# Patient Record
Sex: Female | Born: 1965 | Race: White | Hispanic: No | Marital: Married
Health system: Southern US, Community
[De-identification: ages and names within clinical notes are randomized; demographics above are authoritative.]

---

## 2013-07-10 ENCOUNTER — Emergency Department (HOSPITAL_BASED_OUTPATIENT_CLINIC_OR_DEPARTMENT_OTHER)
Admission: EM | Admit: 2013-07-10 | Discharge: 2013-07-10 | Disposition: A | Discharge status: Home / Self Care | Payer: 59 | Attending: Emergency Medicine | Admitting: Emergency Medicine

## 2013-07-10 ENCOUNTER — Emergency Department (HOSPITAL_BASED_OUTPATIENT_CLINIC_OR_DEPARTMENT_OTHER): Payer: 59

## 2013-07-10 ENCOUNTER — Encounter (HOSPITAL_BASED_OUTPATIENT_CLINIC_OR_DEPARTMENT_OTHER): Payer: Self-pay | Admitting: Emergency Medicine

## 2013-07-10 DIAGNOSIS — R109 Unspecified abdominal pain: Secondary | ICD-10-CM

## 2013-07-10 DIAGNOSIS — R079 Chest pain, unspecified: Secondary | ICD-10-CM

## 2013-07-10 LAB — COMPREHENSIVE METABOLIC PANEL
ALT: 15 U/L (ref 0–35)
AST: 19 U/L (ref 0–37)
Albumin: 3.9 g/dL (ref 3.5–5.2)
Alkaline Phosphatase: 89 U/L (ref 39–117)
BUN: 13 mg/dL (ref 6–23)
CALCIUM: 9.9 mg/dL (ref 8.4–10.5)
CO2: 26 mEq/L (ref 19–32)
Chloride: 99 mEq/L (ref 96–112)
Creatinine, Ser: 0.6 mg/dL (ref 0.50–1.10)
GFR calc non Af Amer: >90 mL/min (ref >90)
GLUCOSE: 96 mg/dL (ref 70–99)
Potassium: 3.8 mEq/L (ref 3.7–5.3)
SODIUM: 139 meq/L (ref 137–147)
Total Bilirubin: 0.3 mg/dL (ref 0.3–1.2)
Total Protein: 7.7 g/dL (ref 6.0–8.3)

## 2013-07-10 LAB — TROPONIN I: Troponin I: <0.3 ng/mL (ref <0.30)

## 2013-07-10 LAB — LIPASE, BLOOD: Lipase: 23 U/L (ref 11–59)

## 2013-07-10 LAB — D-DIMER, QUANTITATIVE (NOT AT ARMC): D-Dimer, Quant: 0.34 ug/mL-FEU (ref 0.00–0.48)

## 2013-07-10 MED ORDER — HYDROCODONE-ACETAMINOPHEN 5-325 MG PO TABS: 1.0000 | ORAL_TABLET | ORAL | Status: AC | PRN

## 2013-07-10 NOTE — ED Provider Notes (Signed)
CSN: [id number]     Arrival date & time 07/10/13  1629 History   First MD Initiated Contact with Patient 07/10/13 1631     Chief Complaint  Patient presents with  . Chest Pain     (Consider location/radiation/quality/duration/timing/severity/associated sxs/prior Treatment) HPI Comments: Pt states that she had acute onset of right sided chest pain last night that radiated to her chest and back. The attack lasted 5 minutes and resolved on its own. Pt states that she has had 2 more episodes today where the second one today the pain radiated to her chest and abdomen as well. She states that she has a pressure on the right side, but is not having the intense pain at this time. Denies diaphoresis, vomiting, sob. Pt states that over the last couple of weeks she has had problems with nausea and reflux. Pt states that she has been taking pepcid to help. She denies any medical problems. Pt states that the pain started both time after eating chili  The history is provided by the patient.    History reviewed. No pertinent past medical history. No past surgical history on file. No family history on file. History  Substance Use Topics  . Smoking status: Not on file  . Smokeless tobacco: Not on file  . Alcohol Use: Not on file   OB History   Grav Para Term Preterm Abortions TAB SAB Ect Mult Living                 Review of Systems  Constitutional: Negative.   Respiratory: Negative.   Cardiovascular: Positive for chest pain.      Allergies  Review of patient's allergies indicates not on file.  Home Medications  No current outpatient prescriptions on file. BP 151/93  Pulse 74  Temp(Src) 97.7 F (36.5 C) (Oral)  Resp 18  Ht 5' (1.524 m)  Wt 180 lb (81.647 kg)  BMI 35.15 kg/m2  SpO2 100% Physical Exam  Nursing note and vitals reviewed. Constitutional: She is oriented to person, place, and time. She appears well-developed and well-nourished.  HENT:  Head: Normocephalic and  atraumatic.  Cardiovascular: Normal rate and regular rhythm.   Pulmonary/Chest: Effort normal and breath sounds normal. She exhibits no tenderness.  Abdominal: Soft. Bowel sounds are normal. There is no tenderness.  Musculoskeletal: Normal range of motion.  Neurological: She is alert and oriented to person, place, and time.  Skin: Skin is warm and dry.  Psychiatric: She has a normal mood and affect.    ED Course  Procedures (including critical care time) Labs Review Labs Reviewed  COMPREHENSIVE METABOLIC PANEL  LIPASE, BLOOD  TROPONIN I  D-DIMER, QUANTITATIVE   Imaging Review Dg Chest 2 View  07/10/2013   CLINICAL DATA:  Chest pain.  EXAM: CHEST  2 VIEW  COMPARISON:  None.  FINDINGS: The heart size and mediastinal contours are within normal limits. Both lungs are clear. The visualized skeletal structures are unremarkable.  IMPRESSION: No active cardiopulmonary disease.   Electronically Signed   By: Salome Holmes M.D.   On: 07/10/2013 17:22     EKG Interpretation None      Date: 07/10/2013  Rate: 62  Rhythm: normal sinus rhythm  QRS Axis: normal  Intervals: normal  ST/T Wave abnormalities: normal  Conduction Disutrbances:none  Narrative Interpretation:   Old EKG Reviewed: none available   MDM   Final diagnoses:  Chest pain  Abdominal pain   Pt to come back tomorrow for an ultrasound. Discussed follow  up with pcp. Doubt acs. Pt is pain free at this time. Will send home with hydrocodone for pain as needed    Teressa LowerVrinda Ivelisse Culverhouse, NP 07/10/13 1840

## 2013-07-10 NOTE — ED Provider Notes (Signed)
  Medical screening examination/treatment/procedure(s) were performed by non-physician practitioner and as supervising physician I was immediately available for consultation/collaboration.  ECG had SR, rate 62, unremarkable   Gerhard Munchobert Demontray Franta, MD 07/10/13 1958

## 2013-07-10 NOTE — Discharge Instructions (Signed)
As discussed for continued or worsening symptoms. Follow up with your doctor Chest Pain (Nonspecific) It is often hard to give a specific diagnosis for the cause of chest pain. There is always a chance that your pain could be related to something serious, such as a heart attack or a blood clot in the lungs. You need to follow up with your caregiver for further evaluation. CAUSES   Heartburn.  Pneumonia or bronchitis.  Anxiety or stress.  Inflammation around your heart (pericarditis) or lung (pleuritis or pleurisy).  A blood clot in the lung.  A collapsed lung (pneumothorax). It can develop suddenly on its own (spontaneous pneumothorax) or from injury (trauma) to the chest.  Shingles infection (herpes zoster virus). The chest wall is composed of bones, muscles, and cartilage. Any of these can be the source of the pain.  The bones can be bruised by injury.  The muscles or cartilage can be strained by coughing or overwork.  The cartilage can be affected by inflammation and become sore (costochondritis). DIAGNOSIS  Lab tests or other studies, such as X-rays, electrocardiography, stress testing, or cardiac imaging, may be needed to find the cause of your pain.  TREATMENT   Treatment depends on what may be causing your chest pain. Treatment may include:  Acid blockers for heartburn.  Anti-inflammatory medicine.  Pain medicine for inflammatory conditions.  Antibiotics if an infection is present.  You may be advised to change lifestyle habits. This includes stopping smoking and avoiding alcohol, caffeine, and chocolate.  You may be advised to keep your head raised (elevated) when sleeping. This reduces the chance of acid going backward from your stomach into your esophagus.  Most of the time, nonspecific chest pain will improve within 2 to 3 days with rest and mild pain medicine. HOME CARE INSTRUCTIONS   If antibiotics were prescribed, take your antibiotics as directed. Finish  them even if you start to feel better.  For the next few days, avoid physical activities that bring on chest pain. Continue physical activities as directed.  Do not smoke.  Avoid drinking alcohol.  Only take over-the-counter or prescription medicine for pain, discomfort, or fever as directed by your caregiver.  Follow your caregiver's suggestions for further testing if your chest pain does not go away.  Keep any follow-up appointments you made. If you do not go to an appointment, you could develop lasting (chronic) problems with pain. If there is any problem keeping an appointment, you must call to reschedule. SEEK MEDICAL CARE IF:   You think you are having problems from the medicine you are taking. Read your medicine instructions carefully.  Your chest pain does not go away, even after treatment.  You develop a rash with blisters on your chest. SEEK IMMEDIATE MEDICAL CARE IF:   You have increased chest pain or pain that spreads to your arm, neck, jaw, back, or abdomen.  You develop shortness of breath, an increasing cough, or you are coughing up blood.  You have severe back or abdominal pain, feel nauseous, or vomit.  You develop severe weakness, fainting, or chills.  You have a fever. THIS IS AN EMERGENCY. Do not wait to see if the pain will go away. Get medical help at once. Call your local emergency services (911 in [place] Do not drive yourself to the hospital. MAKE SURE YOU:   Understand these instructions.  Will watch your condition.  Will get help right away if you are not doing well or get worse. Document Released:  01/12/2005 Document Revised: 06/27/2011 Document Reviewed: 11/08/2007 [facility] Patient Information 2014 [place].

## 2013-07-15 ENCOUNTER — Ambulatory Visit (HOSPITAL_BASED_OUTPATIENT_CLINIC_OR_DEPARTMENT_OTHER): Payer: 59

## 2015-04-06 IMAGING — CR DG CHEST 2V
2 series · 2 of 2 positions shown · non-contrast
Comparison: None.

CLINICAL DATA: Chest pain.

EXAM:
CHEST  2 VIEW

[w chest pa]
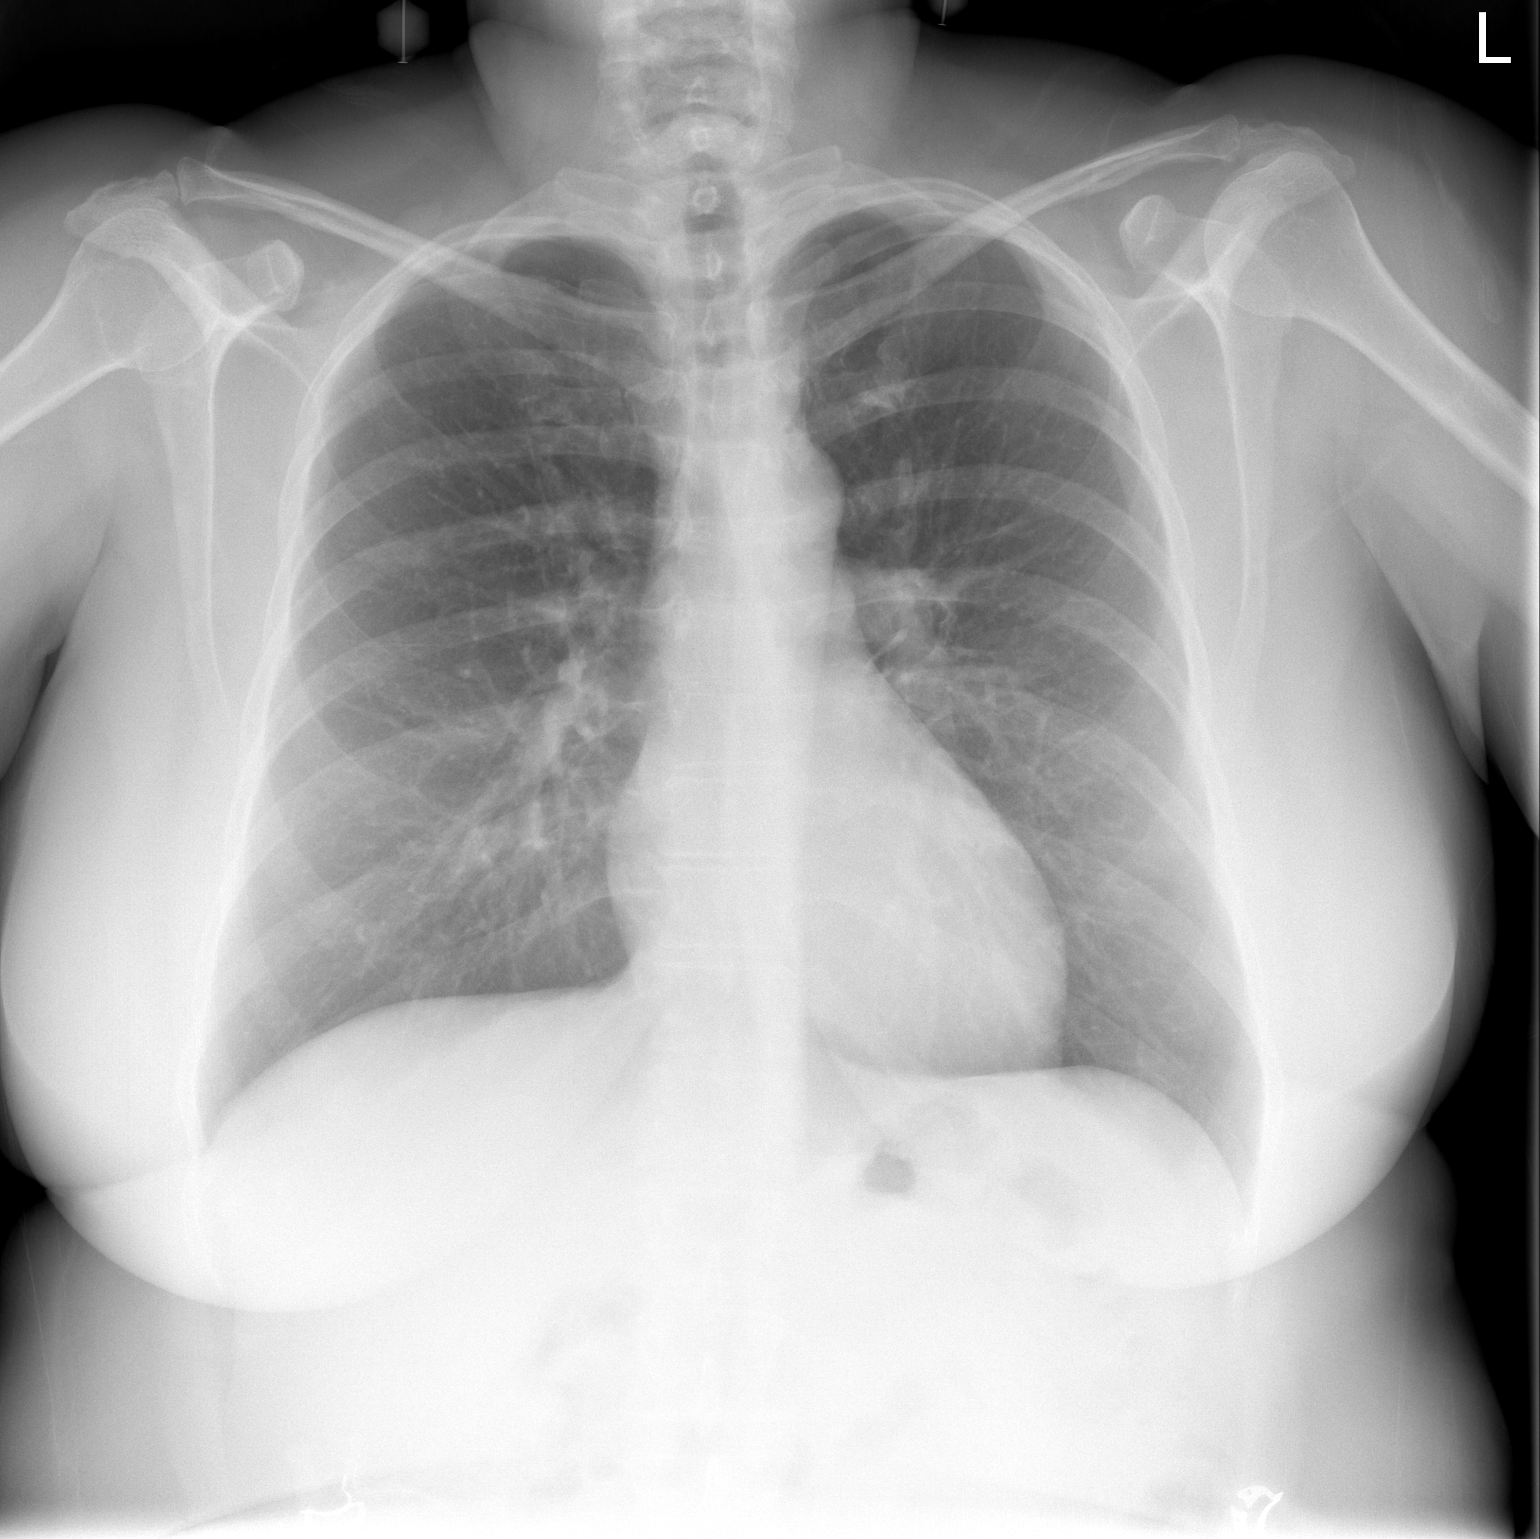

[w chest lat]
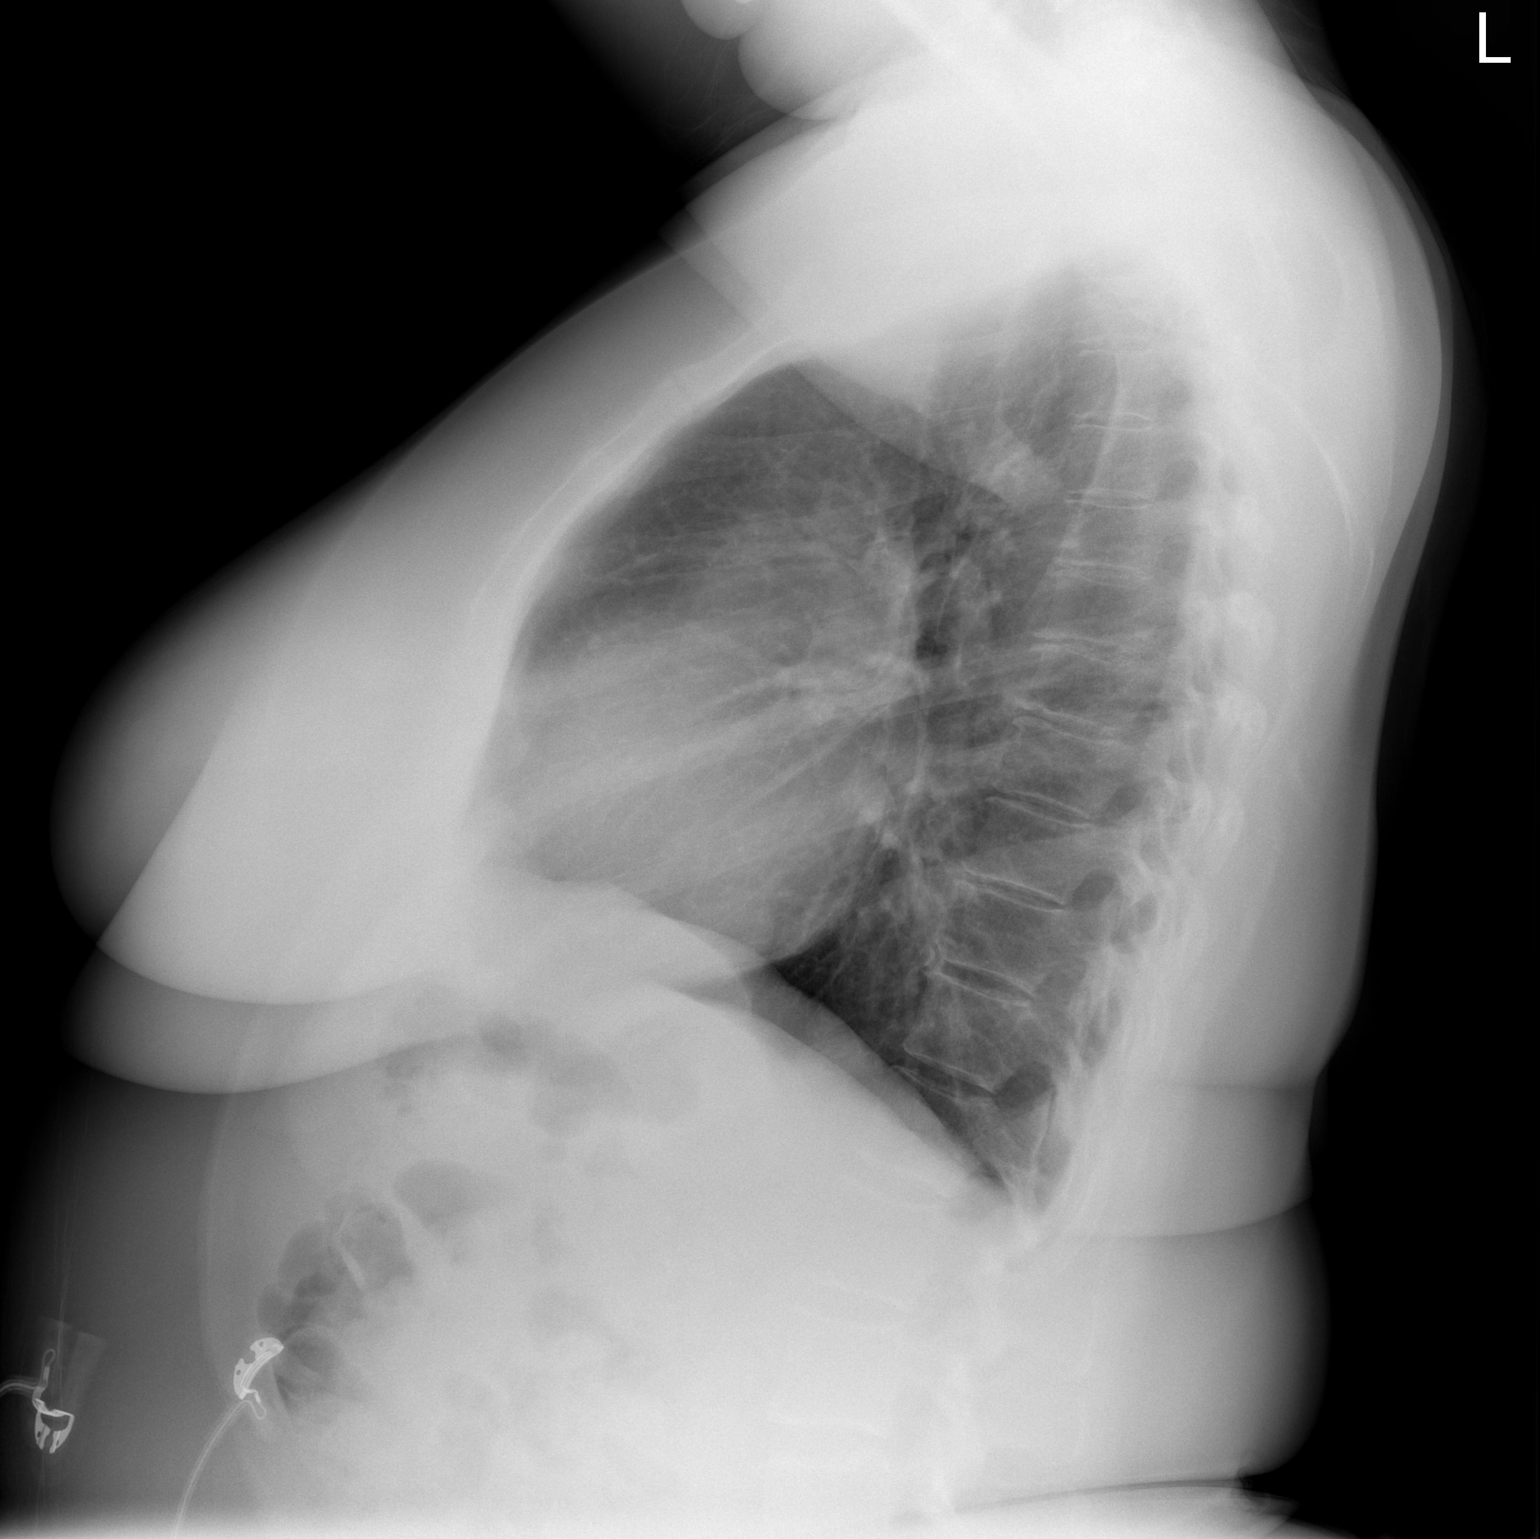

[2 of 2 positions shown; findings below may reference images not displayed]

FINDINGS: The heart size and mediastinal contours are within normal limits.
Both lungs are clear. The visualized skeletal structures are
unremarkable.
IMPRESSION: No active cardiopulmonary disease.

## 2018-07-18 NOTE — H&P (Signed)
TOTAL HIP ADMISSION H&P  Patient is admitted for right total hip arthroplasty, anterior approach  Subjective:  Chief Complaint: Right hip primary OA / pain  HPI: Alisha Banks, 53 y.o. female, has a history of pain and functional disability in the right hip(s) due to arthritis and patient has failed non-surgical conservative treatments for greater than 12 weeks to include NSAID's and/or analgesics, corticosteriod injections, use of assistive devices and activity modification.  Onset of symptoms was gradual starting ~1 years ago with gradually worsening course since that time.The patient noted no past surgery on the right hip(s).  Patient currently rates pain in the right hip at 6 out of 10 with activity. Patient has night pain, worsening of pain with activity and weight bearing, trendelenberg gait, pain that interfers with activities of daily living and pain with passive range of motion. Patient has evidence of periarticular osteophytes and joint space narrowing by imaging studies. This condition presents safety issues increasing the risk of falls.  There is no current active infection.  Risks, benefits and expectations were discussed with the patient.  Risks including but not limited to the risk of anesthesia, blood clots, nerve damage, blood vessel damage, failure of the prosthesis, infection and up to and including death.  Patient understand the risks, benefits and expectations and wishes to proceed with surgery.   PCP: Shellia Cleverly, PA  D/C Plans:       Home   Post-op Meds:       No Rx given  Tranexamic Acid:      To be given - IV   Decadron:      Is to be given  FYI:      ASA  Norco  DME:   Pt already has equipment   PT:   No PT   Pharmacy:   [facility]   There are no active problems to display for this patient.  No past medical history on file.    No current facility-administered medications for this encounter.    Current Outpatient Medications  Medication Sig Dispense  Refill Last Dose  . HYDROcodone-acetaminophen (NORCO/VICODIN) 5-325 MG per tablet Take 1-2 tablets by mouth every 4 (four) hours as needed. 10 tablet 0    Not on File   Social History   Tobacco Use  . Smoking status: Not on file  Substance Use Topics  . Alcohol use: Not on file       Review of Systems  Constitutional: Negative.   HENT: Negative.   Eyes: Negative.   Respiratory: Negative.   Cardiovascular: Negative.   Gastrointestinal: Negative.   Genitourinary: Negative.   Musculoskeletal: Positive for joint pain.  Skin: Negative.   Neurological: Negative.   Endo/Heme/Allergies: Negative.   Psychiatric/Behavioral: Negative.     Objective:  Physical Exam  Constitutional: She is oriented to person, place, and time. She appears well-developed.  HENT:  Head: Normocephalic.  Eyes: Pupils are equal, round, and reactive to light.  Neck: Neck supple. No JVD present. No tracheal deviation present. No thyromegaly present.  Cardiovascular: Normal rate, regular rhythm and intact distal pulses.  Respiratory: Effort normal and breath sounds normal. No respiratory distress. She has no wheezes.  GI: Soft. There is no abdominal tenderness. There is no guarding.  Musculoskeletal:     Right hip: She exhibits decreased range of motion, decreased strength, tenderness and bony tenderness. She exhibits no swelling, no deformity and no laceration.  Lymphadenopathy:    She has no cervical adenopathy.  Neurological: She is  alert and oriented to person, place, and time.  Skin: Skin is warm and dry.  Psychiatric: She has a normal mood and affect.      Labs:  Estimated body mass index is 35.15 kg/m as calculated from the following:   Height as of 07/10/13: 5' (1.524 m).   Weight as of 07/10/13: 81.6 kg.   Imaging Review Plain radiographs demonstrate severe degenerative joint disease of the right hip. The bone quality appears to be good for age and reported activity  level.      Assessment/Plan:  End stage arthritis, right hip  The patient history, physical examination, clinical judgement of the provider and imaging studies are consistent with end stage degenerative joint disease of the right hip and total hip arthroplasty is deemed medically necessary. The treatment options including medical management, injection therapy, arthroscopy and arthroplasty were discussed at length. The risks and benefits of total hip arthroplasty were presented and reviewed. The risks due to aseptic loosening, infection, stiffness, dislocation/subluxation,  thromboembolic complications and other imponderables were discussed.  The patient acknowledged the explanation, agreed to proceed with the plan and consent was signed. Patient is being admitted for inpatient treatment for surgery, pain control, PT, OT, prophylactic antibiotics, VTE prophylaxis, progressive ambulation and ADL's and discharge planning.The patient is planning to be discharged home.    Patient's anticipated LOS is less than 2 midnights, meeting these requirements: - Younger than 109 - Lives within 1 hour of care - Has a competent adult at home to recover with post-op recover - NO history of  - Chronic pain requiring opiods  - Diabetes  - Coronary Artery Disease  - Heart failure  - Heart attack  - Stroke  - DVT/VTE  - Cardiac arrhythmia  - Respiratory Failure/COPD  - Renal failure  - Anemia  - Advanced Liver disease     Alisha Banks. [name]   PA-C  07/23/2018, 8:34 AM

## 2018-08-14 ENCOUNTER — Encounter (HOSPITAL_COMMUNITY): Admission: RE | Payer: Self-pay | Source: Home / Self Care

## 2018-08-14 ENCOUNTER — Inpatient Hospital Stay (HOSPITAL_COMMUNITY): Admission: RE | Admit: 2018-08-14 | Payer: 59 | Source: Home / Self Care | Admitting: Orthopedic Surgery

## 2018-08-14 SURGERY — ARTHROPLASTY, HIP, TOTAL, ANTERIOR APPROACH
Anesthesia: Spinal | Laterality: Right
